# Patient Record
Sex: Male | Born: 1945 | Race: Black or African American | Hispanic: No | Marital: Married
Health system: Southern US, Community
[De-identification: ages and names within clinical notes are randomized; demographics above are authoritative.]

---

## 2019-07-04 ENCOUNTER — Other Ambulatory Visit (HOSPITAL_COMMUNITY): Payer: Self-pay | Admitting: *Deleted

## 2019-07-04 DIAGNOSIS — E859 Amyloidosis, unspecified: Secondary | ICD-10-CM

## 2019-07-22 ENCOUNTER — Telehealth (HOSPITAL_COMMUNITY): Payer: Self-pay | Admitting: Emergency Medicine

## 2019-07-22 NOTE — Telephone Encounter (Signed)
Reaching out to patient to offer assistance regarding upcoming cardiac imaging study; pt verbalizes understanding of appt date/time, parking situation and where to check in, and verified current allergies; name and call back number provided for further questions should they arise Rockwell Alexandria RN Navigator Cardiac Imaging [facility] [phone] office [phone] cell  Pt denies claustrophobia, denies metal implants, denies diabetes supplies

## 2019-07-23 ENCOUNTER — Ambulatory Visit (HOSPITAL_COMMUNITY)
Admission: RE | Admit: 2019-07-23 | Discharge: 2019-07-23 | Disposition: A | Discharge status: Home / Self Care | Payer: No Typology Code available for payment source | Source: Ambulatory Visit | Attending: Internal Medicine | Admitting: Internal Medicine

## 2019-07-23 ENCOUNTER — Other Ambulatory Visit (HOSPITAL_COMMUNITY): Payer: Self-pay | Admitting: Internal Medicine

## 2019-07-23 ENCOUNTER — Other Ambulatory Visit: Payer: Self-pay

## 2019-07-23 DIAGNOSIS — E859 Amyloidosis, unspecified: Secondary | ICD-10-CM | POA: Diagnosis not present

## 2019-07-23 LAB — CREATININE, SERUM
Creatinine, Ser: 1.3 mg/dL — ABNORMAL HIGH (ref 0.61–1.24)
GFR calc Af Amer: >60 mL/min (ref >60)
GFR calc non Af Amer: 54 mL/min — ABNORMAL LOW (ref >60)

## 2019-07-23 MED ORDER — GADOBUTROL 1 MMOL/ML IV SOLN
14.0000 mL | Freq: Once | INTRAVENOUS | Status: AC | PRN
  Administered 2019-07-23: 14 mL via INTRAVENOUS

## 2020-07-09 IMAGING — MR MR CARD MORPHOLOGY WO/W CM
45 of 47 series · 45 of 47 positions shown · IV contrast (Gadavist)
Comparison: none

CLINICAL DATA: Cardiomyopathy R/O Amyloid

EXAM:
CARDIAC MRI
TECHNIQUE: The patient was scanned on a 1.5 Tesla Siemens magnet. A dedicated
cardiac coil was used. Functional imaging was done using Fiest[employer]
sequences. [DATE], and 4 chamber views were done to assess for RWMA's.
Modified Jim rule using a short axis stack was used to
calculate an ejection fraction on a dedicated work station using
Circle software. The patient received 14 cc of Gadavist After 10
minutes inversion recovery sequences were used to assess for
infiltration and scar tissue.
CONTRAST:  Gadavist

[Series 6: bSSFP · oblique · 8.0mm · 1.61mm/px · 1 of 25 slices shown (1 of 20)]
[im 1/25]
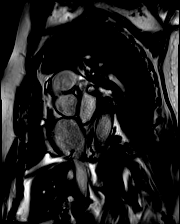

[Series 6: bSSFP · oblique · 8.0mm · 1.61mm/px · 1 of 25 slices shown (2 of 20)]
[im 1/25]
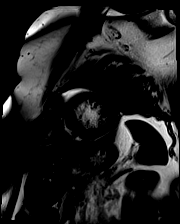

[Series 6: bSSFP · oblique · 8.0mm · 1.61mm/px · 1 of 25 slices shown (3 of 20)]
[im 1/25]
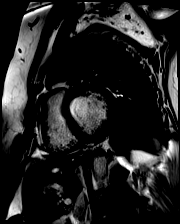

[Series 6: bSSFP · oblique · 8.0mm · 1.61mm/px · 1 of 25 slices shown (4 of 20)]
[im 1/25]
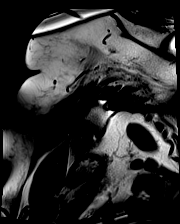

[Series 6: bSSFP · oblique · 8.0mm · 1.61mm/px · 1 of 25 slices shown (5 of 20)]
[im 1/25]
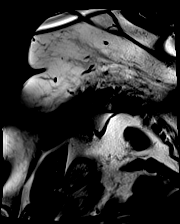

[Series 6: bSSFP · oblique · 8.0mm · 1.61mm/px · 1 of 25 slices shown (6 of 20)]
[im 1/25]
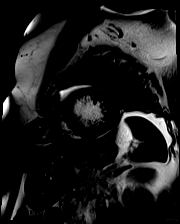

[Series 6: bSSFP · oblique · 8.0mm · 1.61mm/px · 1 of 25 slices shown (7 of 20)]
[im 1/25]
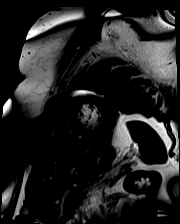

[Series 6: bSSFP · oblique · 8.0mm · 1.61mm/px · 1 of 25 slices shown (8 of 20)]
[im 1/25]
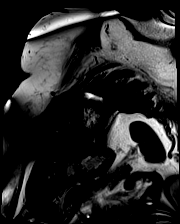

[Series 6: bSSFP · oblique · 8.0mm · 1.61mm/px · 1 of 25 slices shown (9 of 20)]
[im 1/25]
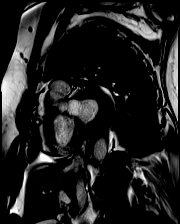

[Series 6: bSSFP · oblique · 8.0mm · 1.61mm/px · 1 of 25 slices shown (10 of 20)]
[im 1/25]
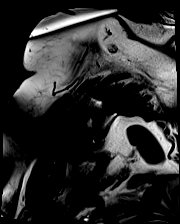

[Series 6: bSSFP · oblique · 8.0mm · 1.61mm/px · 1 of 25 slices shown (11 of 20)]
[im 1/25]
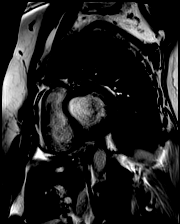

[Series 6: bSSFP · oblique · 8.0mm · 1.61mm/px · 1 of 25 slices shown (12 of 20)]
[im 1/25]
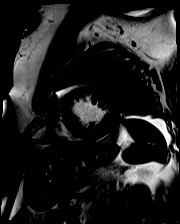

[Series 6: bSSFP · oblique · 8.0mm · 1.61mm/px · 1 of 25 slices shown (13 of 20)]
[im 1/25]
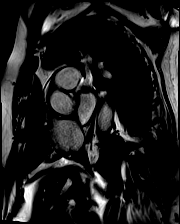

[Series 6: bSSFP · oblique · 8.0mm · 1.61mm/px · 1 of 25 slices shown (14 of 20)]
[im 1/25]
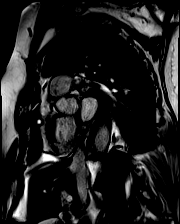

[Series 6: bSSFP · oblique · 8.0mm · 1.61mm/px · 1 of 25 slices shown (15 of 20)]
[im 1/25]
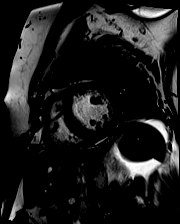

[Series 6: bSSFP · oblique · 8.0mm · 1.61mm/px · 1 of 25 slices shown (16 of 20)]
[im 1/25]
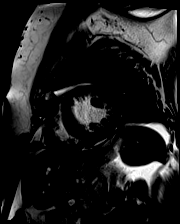

[Series 6: bSSFP · oblique · 8.0mm · 1.61mm/px · 1 of 25 slices shown (17 of 20)]
[im 1/25]
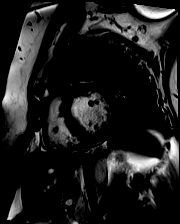

[Series 8: (id)_long_t1_moco · oblique · 8.0mm · 1.48mm/px · 1 of 24 slices shown]
[im 1/24]
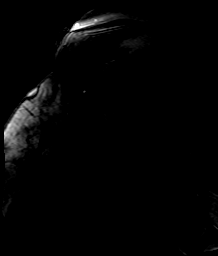

[Series 12: (id)_trufi_moco · oblique · 8.0mm · 1.98mm/px · 1 of 9 slices shown]
[im 1/9]
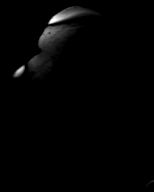

[Series 16: bSSFP · axial · 7.0mm · 1.56mm/px · 1 of 25 slices shown (18 of 20)]
[im 1/25]
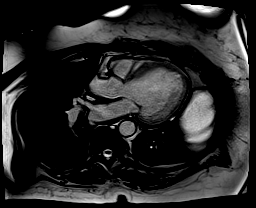

[Series 17: bSSFP · axial · 7.0mm · 1.56mm/px · 1 of 25 slices shown (19 of 20)]
[im 1/25]
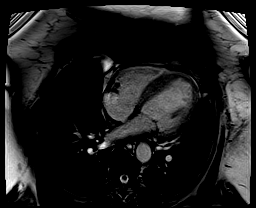

[Series 18: bSSFP · oblique · 7.0mm · 1.56mm/px · 1 of 25 slices shown (20 of 20)]
[im 1/25]
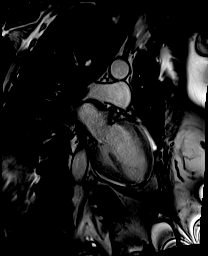

[Series 20: lge_single shot sa · oblique · 8.0mm · 1.98mm/px · 1 of 17 slices shown (1 of 2)]
[im 1/17]
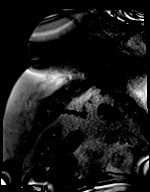

[Series 21: lge_single shot sa · oblique · 8.0mm · 1.98mm/px · 1 of 17 slices shown (2 of 2)]
[im 1/17]
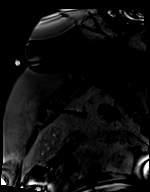

[Series 22: lge_single shot radial_mag · axial · 6.0mm · 2.08mm/px · 1 of 1 slices shown]
[im 1/1]
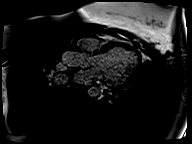

[Series 23: lge_single shot radial_psir · axial · 6.0mm · 2.08mm/px · 1 of 1 slices shown]
[im 1/1]
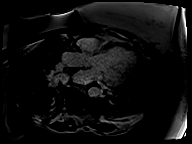

[Series 28: (id)_short_t1 · oblique · 8.0mm · 1.48mm/px · 1 of 27 slices shown]
[im 1/27]
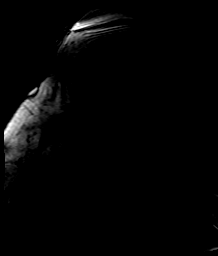

[Series 29: (id)_short_t1_moco · oblique · 8.0mm · 1.48mm/px · 1 of 27 slices shown]
[im 1/27]
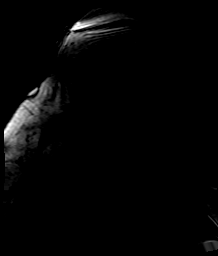

[Series 30: (id)_short_t1_moco_t1 · oblique · 8.0mm · 1.48mm/px · 1 of 6 slices shown]
[im 1/6]
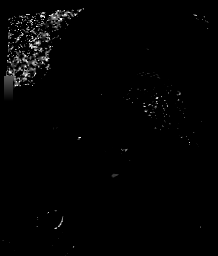

[Series 33: lge short axis_mag · oblique · 8.0mm · 1.70mm/px · 1 of 15 slices shown]
[im 1/15]
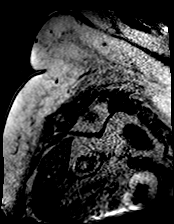

[Series 34: lge short axis_psir · oblique · 8.0mm · 1.70mm/px · 1 of 15 slices shown]
[im 1/15]
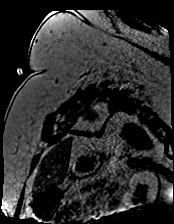

[Series 35: lge radial ((date)ch)_mag · axial · 6.0mm · 1.61mm/px · 1 of 1 slices shown (1 of 7)]
[im 1/1]
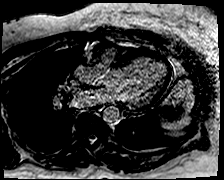

[Series 36: lge radial ((date)ch)_psir · axial · 6.0mm · 1.61mm/px · 1 of 1 slices shown (1 of 7)]
[im 1/1]
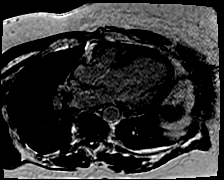

[Series 37: lge radial ((date)ch)_mag · axial · 6.0mm · 1.61mm/px · 1 of 1 slices shown (2 of 7)]
[im 1/1]
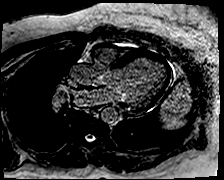

[Series 38: lge radial ((date)ch)_psir · axial · 6.0mm · 1.61mm/px · 1 of 1 slices shown (2 of 7)]
[im 1/1]
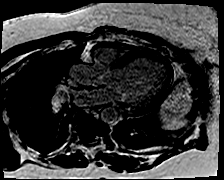

[Series 39: lge radial ((date)ch)_mag · axial · 6.0mm · 1.61mm/px · 1 of 1 slices shown (3 of 7)]
[im 1/1]
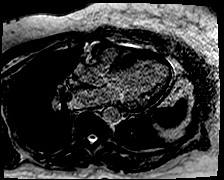

[Series 40: lge radial ((date)ch)_psir · axial · 6.0mm · 1.61mm/px · 1 of 1 slices shown (3 of 7)]
[im 1/1]
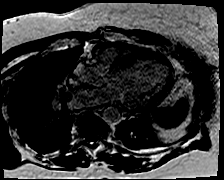

[Series 41: lge radial ((date)ch)_mag · axial · 6.0mm · 1.61mm/px · 1 of 1 slices shown (4 of 7)]
[im 1/1]
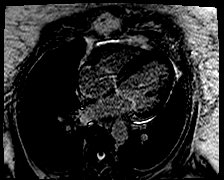

[Series 42: lge radial ((date)ch)_psir · axial · 6.0mm · 1.61mm/px · 1 of 1 slices shown (4 of 7)]
[im 1/1]
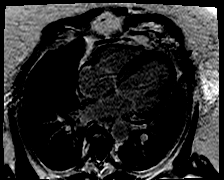

[Series 43: lge radial ((date)ch)_mag · axial · 6.0mm · 1.61mm/px · 1 of 1 slices shown (5 of 7)]
[im 1/1]
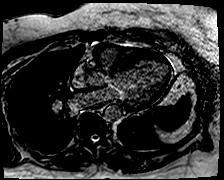

[Series 44: lge radial ((date)ch)_psir · axial · 6.0mm · 1.61mm/px · 1 of 1 slices shown (5 of 7)]
[im 1/1]
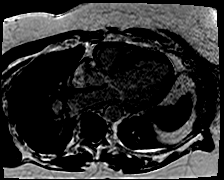

[Series 45: lge radial ((date)ch)_mag · axial · 6.0mm · 1.61mm/px · 1 of 1 slices shown (6 of 7)]
[im 1/1]
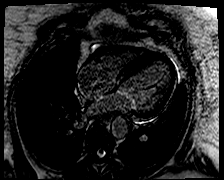

[Series 46: lge radial ((date)ch)_psir · axial · 6.0mm · 1.61mm/px · 1 of 1 slices shown (6 of 7)]
[im 1/1]
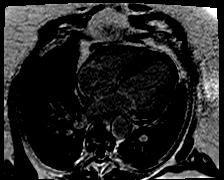

[Series 47: lge radial ((date)ch)_mag · axial · 6.0mm · 1.61mm/px · 1 of 1 slices shown (7 of 7)]
[im 1/1]
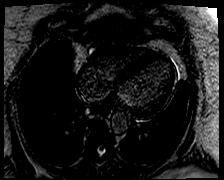

[Series 48: lge radial ((date)ch)_psir · axial · 6.0mm · 1.61mm/px · 1 of 1 slices shown (7 of 7)]
[im 1/1]
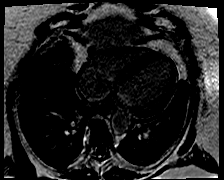

[45 of 47 positions shown; findings below may reference images not displayed]

FINDINGS: Normal atrial sizes. Normal RV size and function. Normal aortic root
3.4 cm. Mobile atrial septum with no obvious PFO/ASD. Trivial
pericardial effusion. Tri leaflet AV with mild appearing AR. Mildly
thickened MV with mild MR. Normal TV with mild appearing TR. Mild
septal hypertrophy 13 mm. No K APPIAH or LVOT turbulence Moderate LVE
with diffuse hypokinesis and abnormal septal motion. Quantitative EF
38% (EDV 201 cc ESV 125 cc SV 76 cc) Delayed gadolinium images show
mild diffuse subendocardium uptake worse in the basal septum

Parametric measures also abnormal.

T1: Global 3352 msec (normal < 930 msec)

T2: 60-69 msec apical AHA segments 57-68 msec AHA mid segments and
58-62 msec basal segments (normal < 52 msec)

ECV: Calculated using Hct of 45 elevated at 35% (normal < 28% )
IMPRESSION: 1. Moderate LVE with mild LVH septal thickness 13 mm quantitative EF
38% diffuse hypokinesis abnormal septal motion

2.  Normal RV size and function

3.  Mild MR, Mild AR and mild TR

4.  Trivial pericardial effusion

5. Mild diffuse subendocardial gadolinium uptake on delayed
inversion recovery sequences worse in the basal septum

6. Abnormal parametric measures with increased T1/T2 and ECG see
above

Findings are consistent with amyloidosis Can consider f/u (99m)
Tc-PYP nuclear scintigraphy scan for confirmation

Hiram Monsivais
